# Patient Record
Sex: Male | Born: 1935 | Race: White | Hispanic: No | Marital: Married | Smoking: Never smoker
Health system: Southern US, Community
[De-identification: ages and names within clinical notes are randomized; demographics above are authoritative.]

## PROBLEM LIST (undated history)

## (undated) DIAGNOSIS — F0390 Unspecified dementia without behavioral disturbance: Secondary | ICD-10-CM

## (undated) HISTORY — DX: Unspecified dementia, unspecified severity, without behavioral disturbance, psychotic disturbance, mood disturbance, and anxiety: F03.90

---

## 2020-08-29 ENCOUNTER — Other Ambulatory Visit: Payer: Self-pay

## 2020-08-29 ENCOUNTER — Emergency Department (HOSPITAL_COMMUNITY): Payer: Medicare Other

## 2020-08-29 ENCOUNTER — Emergency Department (HOSPITAL_COMMUNITY)
Admission: EM | Admit: 2020-08-29 | Discharge: 2020-08-29 | Disposition: A | Discharge status: Skilled Nursing Facility | Payer: Medicare Other | Attending: Emergency Medicine | Admitting: Emergency Medicine

## 2020-08-29 ENCOUNTER — Encounter (HOSPITAL_COMMUNITY): Payer: Self-pay | Admitting: Emergency Medicine

## 2020-08-29 DIAGNOSIS — W19XXXA Unspecified fall, initial encounter: Secondary | ICD-10-CM | POA: Diagnosis not present

## 2020-08-29 DIAGNOSIS — S63259A Unspecified dislocation of unspecified finger, initial encounter: Secondary | ICD-10-CM

## 2020-08-29 DIAGNOSIS — S0031XA Abrasion of nose, initial encounter: Secondary | ICD-10-CM

## 2020-08-29 DIAGNOSIS — S63111A Subluxation of metacarpophalangeal joint of right thumb, initial encounter: Secondary | ICD-10-CM | POA: Diagnosis not present

## 2020-08-29 DIAGNOSIS — S60931A Unspecified superficial injury of right thumb, initial encounter: Secondary | ICD-10-CM | POA: Diagnosis present

## 2020-08-29 DIAGNOSIS — F0390 Unspecified dementia without behavioral disturbance: Secondary | ICD-10-CM | POA: Insufficient documentation

## 2020-08-29 MED ORDER — BACITRACIN-NEOMYCIN-POLYMYXIN 400-5-5000 EX OINT
TOPICAL_OINTMENT | Freq: Once | CUTANEOUS | Status: AC
  Administered 2020-08-29: 1 via TOPICAL
  Filled 2020-08-29: qty 1

## 2020-08-29 NOTE — ED Provider Notes (Signed)
[facility] EMERGENCY DEPARTMENT Provider Note   CSN: [id number] Arrival date & time: 08/29/20  1829     History Chief Complaint  Patient presents with  . Fall    Jeffrey Small is a 86 y.o. male.  HPI   This patient is a pleasant 86 year old male with a history of dementia, presents from his care facility after having an unwitnessed fall, found to have a right thumb deformity as well as an abrasion of the bridge of his nose.  Otherwise at his baseline.  He is friendly, tries to answer questions, otherwise has no specific complaints.  Level 5 caveat applies secondary to dementia  Past Medical History:  Diagnosis Date  . Dementia (HCC)     There are no problems to display for this patient.   History reviewed. No pertinent surgical history.     History reviewed. No pertinent family history.  Social History   Tobacco Use  . Smoking status: Never Smoker  . Smokeless tobacco: Never Used  Substance Use Topics  . Alcohol use: Not Currently  . Drug use: Never    Home Medications Prior to Admission medications   Not on File    Allergies    Nsaids  Review of Systems   Review of Systems  Unable to perform ROS: Dementia    Physical Exam Updated Vital Signs BP 126/66   Pulse 67   Temp (!) 97.4 F (36.3 C) (Oral)   Resp 18   Ht 1.702 m (5\' 7" )   Wt 59 kg   SpO2 100%   BMI 20.36 kg/m   Physical Exam Vitals and nursing note reviewed.  Constitutional:      General: He is not in acute distress.    Appearance: He is well-developed.  HENT:     Head: Normocephalic.     Comments: Abrasion over nasal bridge, no repairable lacreation, no other signs of head trauma.    Mouth/Throat:     Pharynx: No oropharyngeal exudate.  Eyes:     General: No scleral icterus.       Right eye: No discharge.        Left eye: No discharge.     Conjunctiva/sclera: Conjunctivae normal.     Pupils: Pupils are equal, round, and reactive to light.  Neck:     Thyroid: No  thyromegaly.     Vascular: No JVD.  Cardiovascular:     Rate and Rhythm: Normal rate and regular rhythm.     Heart sounds: Normal heart sounds. No murmur heard.  No friction rub. No gallop.   Pulmonary:     Effort: Pulmonary effort is normal. No respiratory distress.     Breath sounds: Normal breath sounds. No wheezing or rales.  Abdominal:     General: Bowel sounds are normal. There is no distension.     Palpations: Abdomen is soft. There is no mass.     Tenderness: There is no abdominal tenderness.  Musculoskeletal:        General: Tenderness and deformity present.     Cervical back: Normal range of motion and neck supple.     Comments: Has MCP deformity, pain with ROM, no other joint tenderness diffusely in the body  Lymphadenopathy:     Cervical: No cervical adenopathy.  Skin:    General: Skin is warm and dry.     Findings: No erythema or rash.  Neurological:     Mental Status: He is alert.     Coordination: Coordination normal.  Psychiatric:        Behavior: Behavior normal.     ED Results / Procedures / Treatments   Labs (all labs ordered are listed, but only abnormal results are displayed) Labs Reviewed - No data to display  EKG None  Radiology DG Hand Complete Right  Result Date: 08/29/2020 CLINICAL DATA:  Status post reduction EXAM: RIGHT HAND - COMPLETE 3+ VIEW COMPARISON:  Radiograph same day FINDINGS: There is been interval reduction of the dorsal dislocation at the first MCP joint. There is near anatomic alignment. No definite acute fracture is noted. Overlying soft tissue swelling is seen. There is diffuse osteopenia. 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 IMPRESSION: Interval reduction of first MCP joint dislocation Electronically Signed   By: Jonna Clark M.D.   On: 08/29/2020 20:06   DG Hand Complete Right  Result Date: 08/29/2020 CLINICAL DATA:  Status post fall. EXAM: RIGHT HAND - COMPLETE 3+ VIEW COMPARISON:  None. FINDINGS: There is dorsal  dislocation of the metacarpal phalangeal joint of the right thumb. No acute fracture is identified. Mild degenerative changes seen involving the carpometacarpal articulation of the right thumb. Soft tissues are unremarkable. IMPRESSION: Dorsal dislocation of the right thumb at the metacarpophalangeal joint. Electronically Signed   By: Aram Candela M.D.   On: 08/29/2020 19:39    Procedures Reduction of dislocation  Date/Time: 08/29/2020 7:38 PM Performed by: Eber Hong, MD Authorized by: Eber Hong, MD  Consent given by: patient Patient understanding: patient states understanding of the procedure being performed Test results: test results available and properly labeled Imaging studies: imaging studies available Required items: required blood products, implants, devices, and special equipment available Patient identity confirmed: arm band Time out: Immediately prior to procedure a "time out" was called to verify the correct patient, procedure, equipment, support staff and site/side marked as required. Preparation: Patient was prepped and draped in the usual sterile fashion. Local anesthesia used: no  Anesthesia: Local anesthesia used: no  Sedation: Patient sedated: no  Patient tolerance: patient tolerated the procedure well with no immediate complications Comments: Orthopedic dislocation reduction with manipulation of the joint on first attempt, thumb spica placed by nursing afterwards, neurovascularly intact, repeat x-ray obtained      (including critical care time)  Medications Ordered in ED Medications  neomycin-bacitracin-polymyxin (NEOSPORIN) ointment packet (1 application Topical Given 08/29/20 1946)    ED Course  I have reviewed the triage vital signs and the nursing notes.  Pertinent labs & imaging results that were available during my care of the patient were reviewed by me and considered in my medical decision making (see chart for details).    MDM  Rules/Calculators/A&P                          Xray - rule out fracture of the finger - feels clinically to be dislocated.  I have personally viewed the x-rays, there does appear to be a metacarpal phalangeal dislocation of the first finger (thumb), this was reduced on the first attempt, repeat imaging obtained.  Postreduction films appear well relocated, immobilized in a thumb spica splint, at this time the patient is well-appearing, wounds treated over the nose with topical antibiotics and wound care and cleansing.  Stable for discharge can follow-up in the outpatient setting with orthopedics  Final Clinical Impression(s) / ED Diagnoses Final diagnoses:  Finger dislocation, initial encounter  Abrasion of nose, initial encounter    Rx / DC Orders ED Discharge Orders  None       Eber Hong, MD 08/29/20 2101

## 2020-08-29 NOTE — Discharge Instructions (Signed)
Tylenol or ibuprofen for pain, you may follow-up with our local orthopedist Dr. Romeo Apple within the next 10 to 14 days, please wear the splint at all times, if your finger dislocates again you can come back to the doctor or see your doctor or nurse practitioner at your facility.  It is easily reducible

## 2020-08-29 NOTE — ED Triage Notes (Signed)
Patient brought in by [facility] for a fall. Patient does have dementia. Patient had unwitnessed fall today and has obvious deformity to his right thumb with some abrasions to his nose.

## 2020-08-29 NOTE — ED Notes (Signed)
Wrist splint with thumb stabilizer applied to pt's Rt arm as per orders of Dr. Hyacinth Meeker. Pt tolerated well.

## 2020-08-29 NOTE — ED Notes (Signed)
Abrasion to bridge of nose cleaned, bacitracin ointment applied,bandaid placed over wound. Pt tolerated well.

## 2020-09-16 ENCOUNTER — Encounter: Payer: Self-pay | Admitting: Orthopedic Surgery

## 2020-09-16 ENCOUNTER — Ambulatory Visit (INDEPENDENT_AMBULATORY_CARE_PROVIDER_SITE_OTHER): Payer: Medicare Other | Admitting: Orthopedic Surgery

## 2020-09-16 ENCOUNTER — Ambulatory Visit: Payer: Medicare Other

## 2020-09-16 ENCOUNTER — Other Ambulatory Visit: Payer: Self-pay

## 2020-09-16 VITALS — BP 112/56 | HR 58 | Ht 67.0 in | Wt 117.0 lb

## 2020-09-16 DIAGNOSIS — S63106A Unspecified dislocation of unspecified thumb, initial encounter: Secondary | ICD-10-CM

## 2020-09-16 DIAGNOSIS — S63104A Unspecified dislocation of right thumb, initial encounter: Secondary | ICD-10-CM

## 2020-09-16 DIAGNOSIS — S63259A Unspecified dislocation of unspecified finger, initial encounter: Secondary | ICD-10-CM

## 2020-09-16 NOTE — Patient Instructions (Signed)
1.  Continue to use splint at all times 2.  Avoid lifting and gripping with right hand as much as possible 3.  NWB R hand 4.  F/u 2 weeks for repeat XR

## 2020-09-16 NOTE — Progress Notes (Signed)
New Patient Visit  Assessment: Jeffrey Small is a 86 y.o. male with the following: 1. Dislocation of thumb, initial encounter  Plan: Jeffrey Small sustained a dislocation of the right thumb MCP approximately 2 weeks ago.  He has done well using a removable wrist splint.  He demonstrates no pain on physical exam today.  There is no swelling appreciated.  Radiographs demonstrate appropriate alignment.  We will plan to continue with the removable thumb spica splint for an additional 2 weeks.  After that time, we will remove the splint, and allow him to work on his range of motion.  The patient does have dementia and so they will be some concerns for his ability to follow commands, but an additional 2 weeks of immobilization should allow everything to heal and be stable at that time.  This was discussed with the patient's daughter who was available in clinic today, and she is agreeable with this plan.  Documentation was filled out for his assisted living facility.  Follow-up: Return in about 2 weeks (around 09/30/2020).  Please obtain x-rays of the right thumb at the next visit.  Subjective:  Chief Complaint  Patient presents with  . Finger Injury    Right thumb dislocation DOI 08/29/20    History of Present Illness: Jeffrey Small is a 86 y.o. RHD male who presents with right thumb pain.  Approximately 2 weeks ago, he sustained a fall at his assisted living facility.  He is noted to have a dislocation.  This was reduced in the ED, he was placed in a removable wrist splint.  He has worn this at all times.  According to his daughter, he has tried to remove it, but has had little success.  He has otherwise tolerated the splint well.  He is not in any discomfort.  No additional injuries sustained at that time.  He does have some dementia, and has difficulty providing appropriate information.  Most of this information was provided by his daughter.  Review of Systems: No fevers or chills No  numbness or tingling No chest pain  Medical History:  Past Medical History:  Diagnosis Date  . Dementia (HCC)     No past surgical history on file.  No family history on file. Social History   Tobacco Use  . Smoking status: Never Smoker  . Smokeless tobacco: Never Used  Substance Use Topics  . Alcohol use: Not Currently  . Drug use: Never    Allergies  Allergen Reactions  . Nsaids     Current Meds  Medication Sig  . acetaminophen (TYLENOL) 500 MG tablet Take 500 mg by mouth every 6 (six) hours as needed.  . Cholecalciferol (VITAMIN D3 PO) Take 125 mcg by mouth daily.  . citalopram (CELEXA) 10 MG tablet Take 10 mg by mouth daily.  . Cyanocobalamin (VITAMIN B 12 PO) Take 1,000 mcg by mouth daily.  Marland Kitchen dipyridamole-aspirin (AGGRENOX) 200-25 MG 12hr capsule Take 1 capsule by mouth daily.  . ferrous sulfate 325 (65 FE) MG tablet Take 325 mg by mouth daily with breakfast.  . guaifenesin (ROBITUSSIN) 100 MG/5ML syrup Take 200 mg by mouth 3 (three) times daily as needed for cough.  . loperamide (IMODIUM) 2 MG capsule Take 2 mg by mouth as needed for diarrhea or loose stools.  . magnesium hydroxide (MILK OF MAGNESIA) 400 MG/5ML suspension Take by mouth daily as needed for mild constipation.  Marland Kitchen neomycin-bacitracin-polymyxin (NEOSPORIN) ointment Apply 1 application topically 2 (two) times daily. Apply to scalp twice  daily  . polyethylene glycol (MIRALAX / GLYCOLAX) 17 g packet Take 17 g by mouth daily.    Objective: BP (!) 112/56   Pulse (!) 58   Ht 5\' 7"  (1.702 m)   Wt 117 lb (53.1 kg)   BMI 18.32 kg/m   Physical Exam:  General: No acute distress.  Difficulty answering basic questions. Gait: Slow shuffling gait.  Evaluation of the right thumb demonstrates no obvious effusion.  There is no tenderness to palpation.  He tolerates gentle range of motion without obvious discomfort.  He is able to make a full fist.  The MCP is stable to limited testing.  Fingers are warm and  well perfused.  No skin breakdown related to the use of the splint.    IMAGING: I personally ordered and reviewed the following images:  X-ray right thumb -good alignment overall.  No evidence of fracture.  The MCP remains reduced without any residual subluxation.  Impression: Normal-appearing x-ray.  MCP joint remains reduced   New Medications:  No orders of the defined types were placed in this encounter.     , MD  09/16/2020 11:50 AM

## 2020-09-30 ENCOUNTER — Encounter: Payer: Self-pay | Admitting: Orthopedic Surgery

## 2020-09-30 ENCOUNTER — Ambulatory Visit (INDEPENDENT_AMBULATORY_CARE_PROVIDER_SITE_OTHER): Payer: Medicare Other | Admitting: Orthopedic Surgery

## 2020-09-30 ENCOUNTER — Ambulatory Visit: Payer: Medicare Other

## 2020-09-30 ENCOUNTER — Other Ambulatory Visit: Payer: Self-pay

## 2020-09-30 VITALS — Ht 67.0 in | Wt 117.0 lb

## 2020-09-30 DIAGNOSIS — S63104D Unspecified dislocation of right thumb, subsequent encounter: Secondary | ICD-10-CM

## 2020-09-30 NOTE — Progress Notes (Signed)
New Patient Visit  Assessment: Jeffrey Small is a 86 y.o. male with the following: 1. Dislocation of thumb, initial encounter  Plan: [name] has done very well following his thumb dislocation.  He has worn the thumb spica splint with little difficulty.  XR show that his thumb remains reduced.  He has no pain on physical exam and can easily make a fist.  No further immobilization is necessary.  Recommend fall precautions at his living facility.  No further follow-up is needed.  Follow-up: Return if symptoms worsen or fail to improve.   Subjective:  Chief Complaint  Patient presents with  . Hand Pain    F/u Rt thumb dislocation     History of Present Illness: Jeffrey Small is a 86 y.o. RHD male who presents with right thumb pain.  He is now approximately 1 month out from a fall at his assisted living facility, with subsequent dislocation of his thumb.  He was adequately reduced, placed in a removable thumb spica splint and has done well.  No pain reported.  No recent falls.  He has tried to remove the splint on occasion, is otherwise done fairly well.  He does not appear to be in any pain.  Per his daughter, he has been comfortable.  Review of Systems: No fevers or chills No numbness or tingling No chest pain   Objective: Ht 5\' 7"  (1.702 m)   Wt 117 lb (53.1 kg)   BMI 18.32 kg/m   Physical Exam:  General: No acute distress.  Gait: Slow shuffling gait.  No swelling of the thumb He has full range of motion of all fingers, including the thumb No tenderness to palpation at the MCP or IP joints.   Fingers are warm and well perfused.   IMAGING: I personally ordered and reviewed the following images:  X-ray right thumb -good alignment.  No acute injury is appreciated.  Appropriate alignment maintenance of reduction of the MCP joint.  Impression: Normal-appearing x-ray.  MCP joint remains reduced   New Medications:  No orders of the defined types were placed in  this encounter.     , MD  09/30/2020 11:56 AM

## 2020-10-18 IMAGING — DX DG HAND COMPLETE 3+V*R*
4 series · 4 of 4 positions shown · non-contrast
Comparison: Radiograph same day

CLINICAL DATA: Status post reduction

EXAM:
RIGHT HAND - COMPLETE 3+ VIEW

[hand ap]
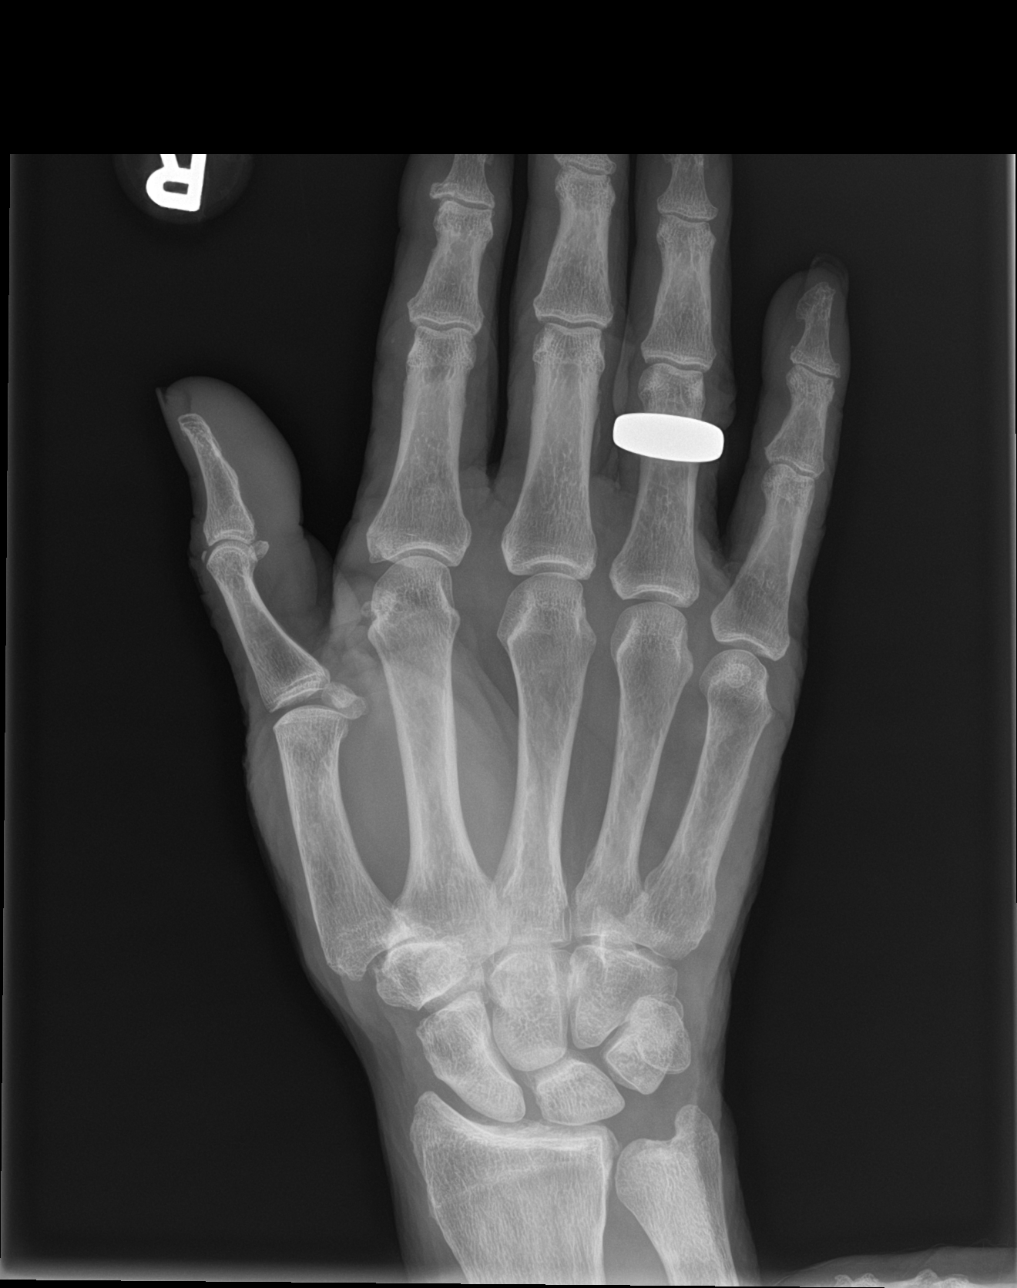

[hand obl]
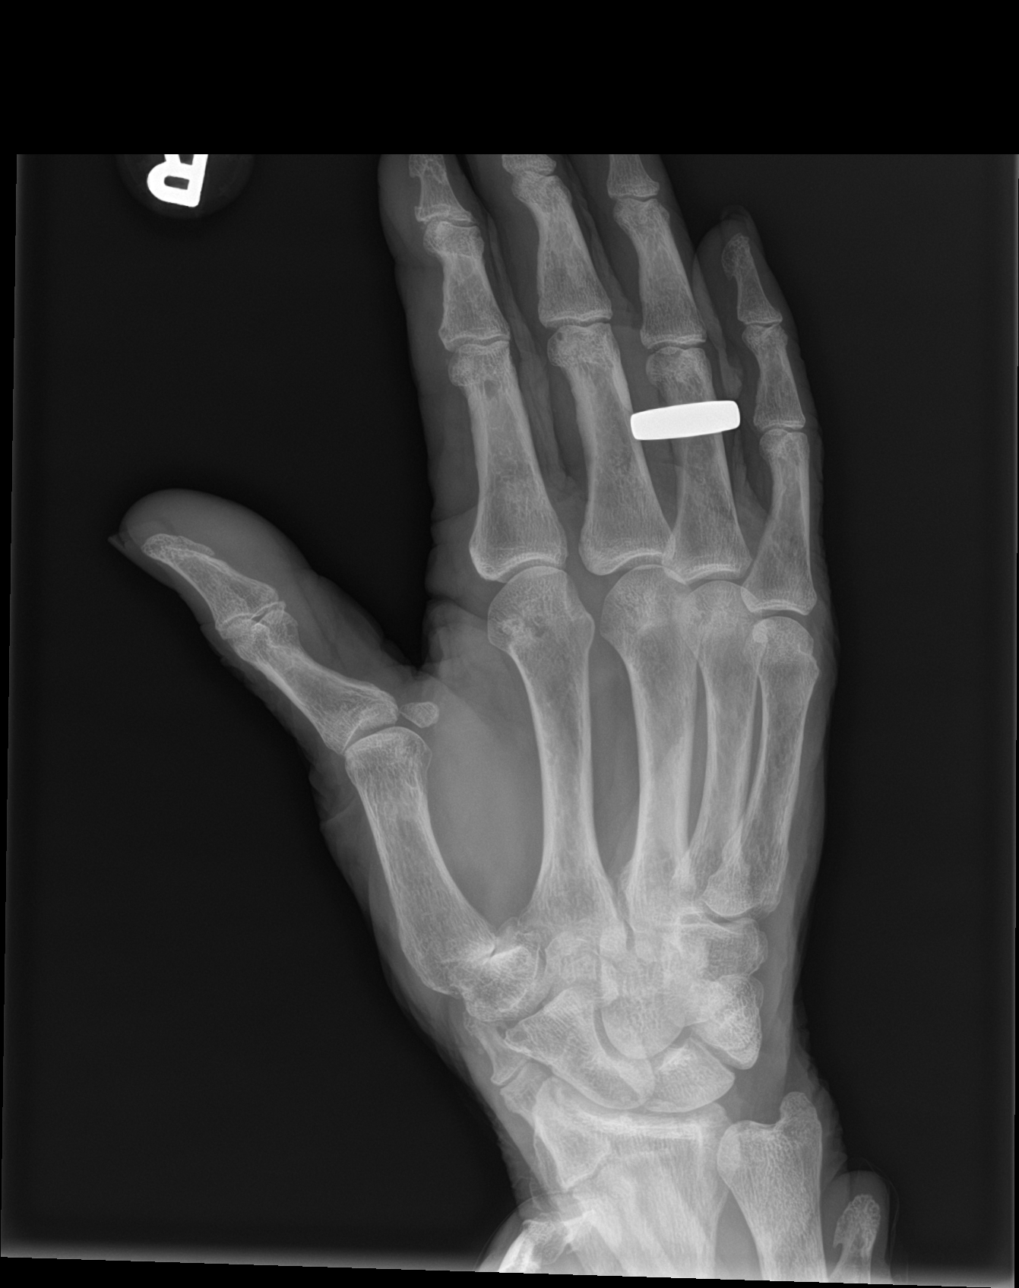

[hand lat]
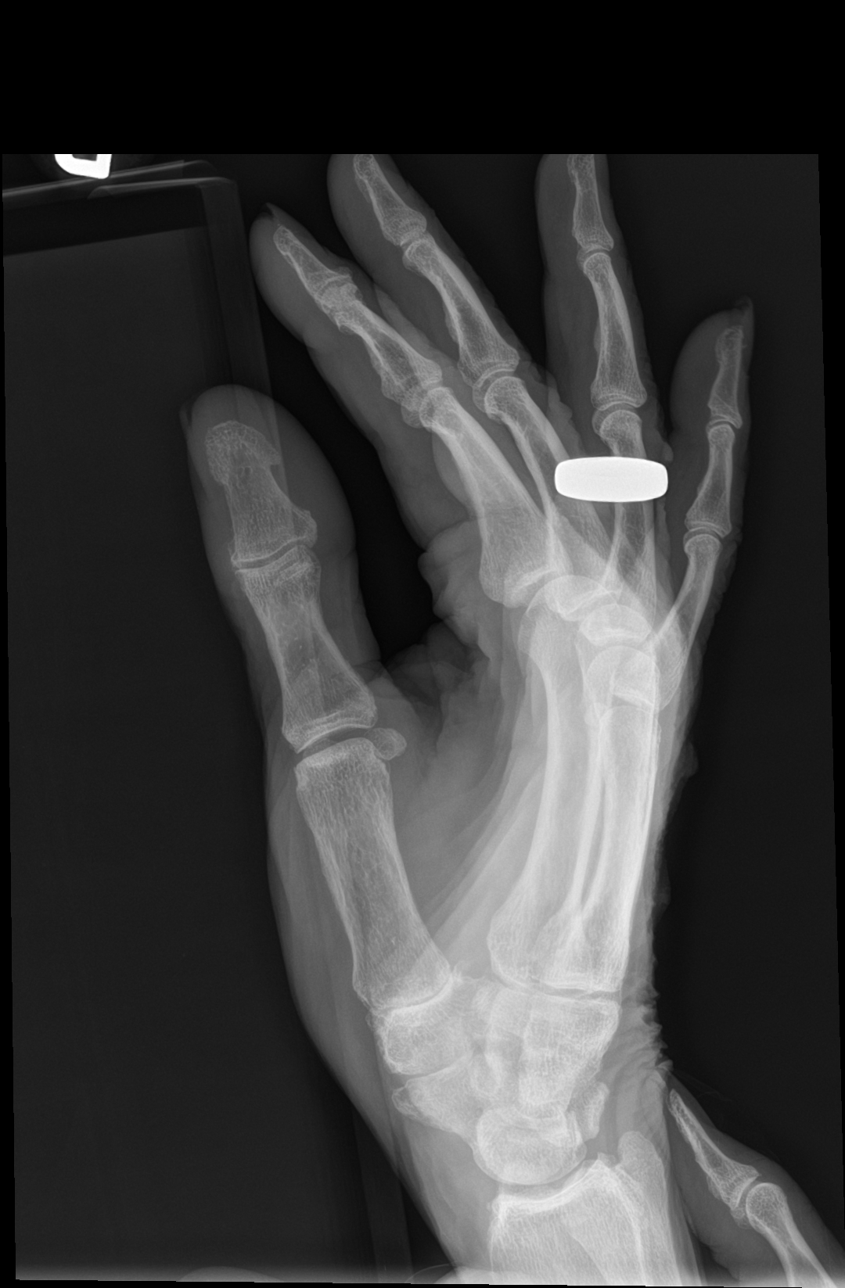

[finger ap]
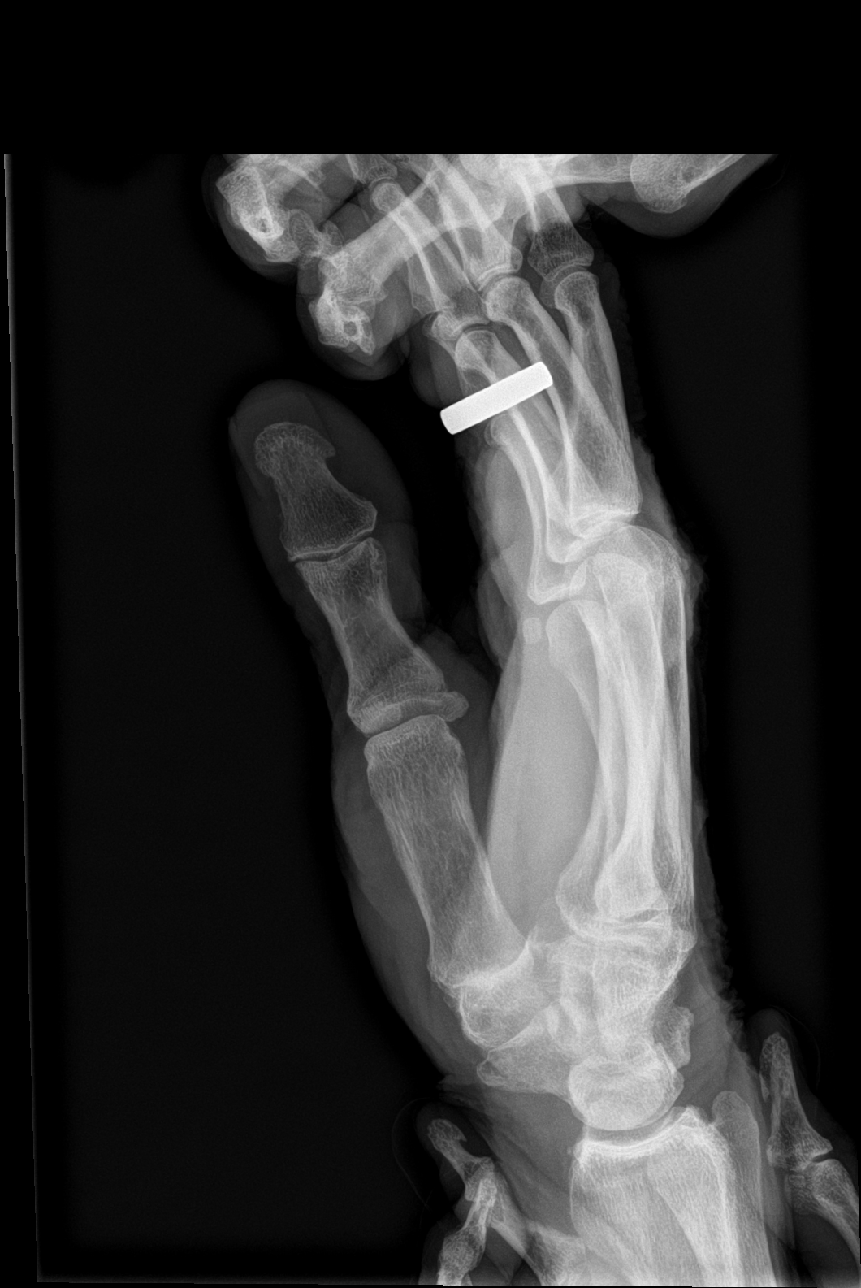

[4 of 4 positions shown; findings below may reference images not displayed]

FINDINGS: There is been interval reduction of the dorsal dislocation at the
first MCP joint. There is near anatomic alignment. No definite acute
fracture is noted. Overlying soft tissue swelling is seen. There is
diffuse osteopenia. First [facility] joint osteoarthritis is noted.
IMPRESSION: Interval reduction of first MCP joint dislocation

## 2021-02-26 DEATH — deceased
